# Patient Record
Sex: Male | Born: 1990 | Race: White | Hispanic: No | Marital: Single | Smoking: Never smoker
Health system: Southern US, Community
[De-identification: ages and names within clinical notes are randomized; demographics above are authoritative.]

---

## 2009-07-25 ENCOUNTER — Ambulatory Visit: Payer: Self-pay | Admitting: General Practice

## 2011-07-20 IMAGING — CT CT STONE STUDY
1 of 2 series · 15 of 32 positions shown, 19 images · non-contrast
Comparison: none

stone
COMMENTS:

PROCEDURE:     CT  - CT ABDOMEN /PELVIS WO (STONE)  - July 25, 2009  [DATE]
RESULT:     CT abdomen and pelvis dated 07/25/2009
TECHNIQUE: Helical noncontrasted 3 mm sections were obtained from the lung
bases through the pubic symphysis.

[Series 2: soft tissue · axial · 0.74mm/px · z∈[-855,-432]mm · 15 of 155 slices shown, 19 images]
[im 7/155  soft-tissue]
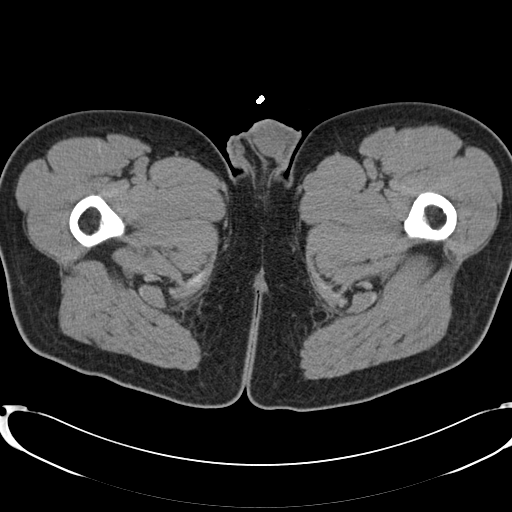
[im 7/155  bone]
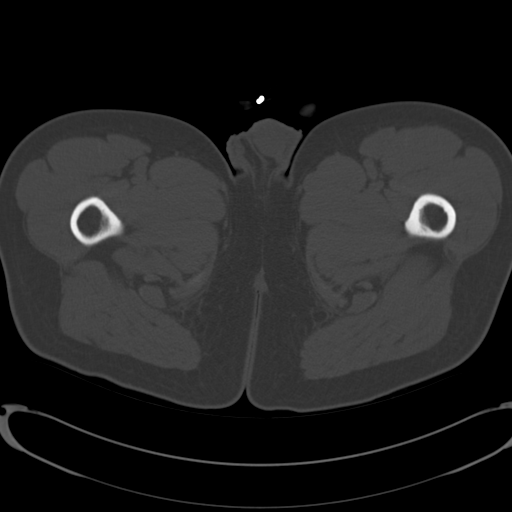
[im 20/155  soft-tissue]
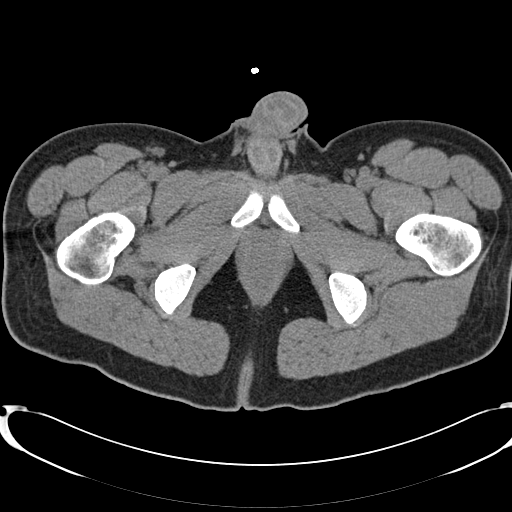
[im 33/155  soft-tissue]
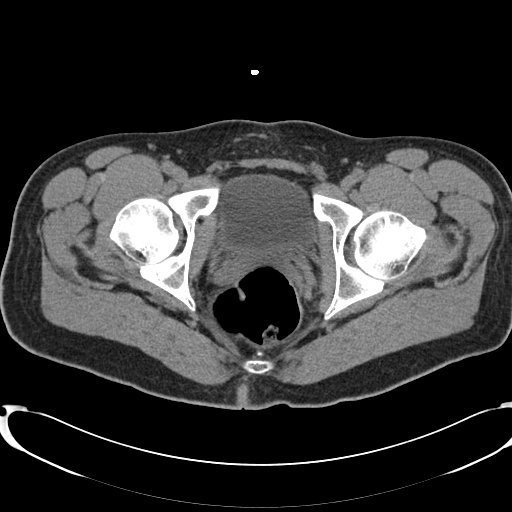
[im 45/155  soft-tissue]
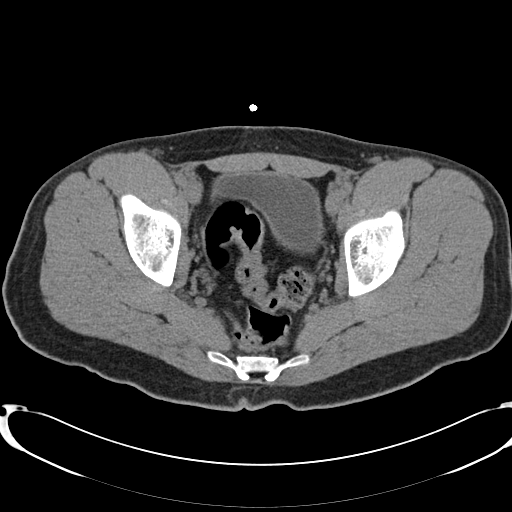
[im 52/155  soft-tissue]
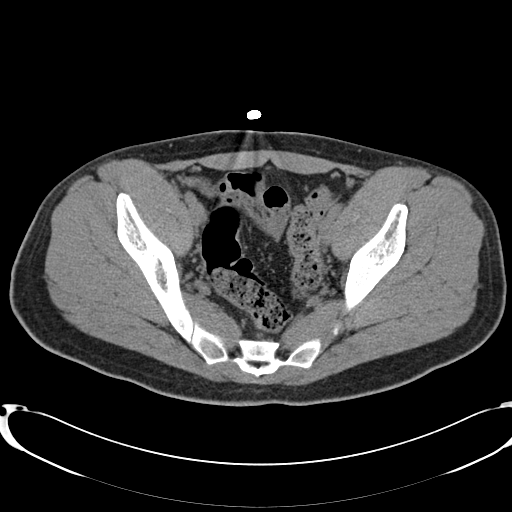
[im 65/155  soft-tissue]
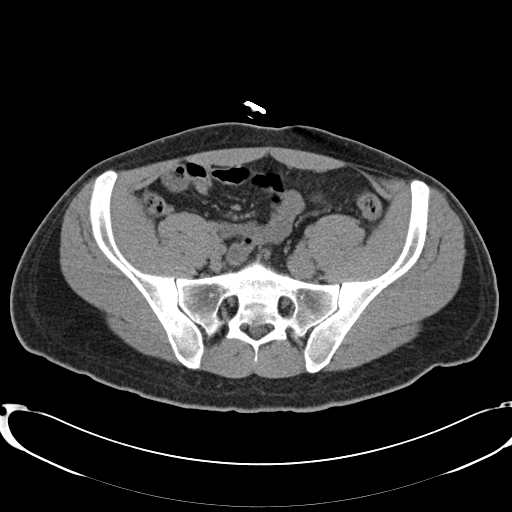
[im 78/155  soft-tissue]
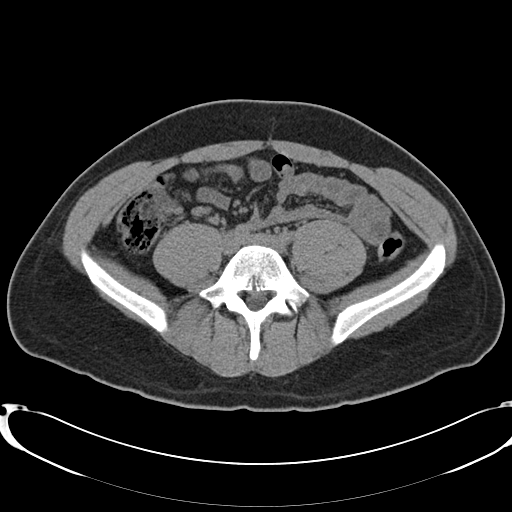
[im 90/155  soft-tissue]
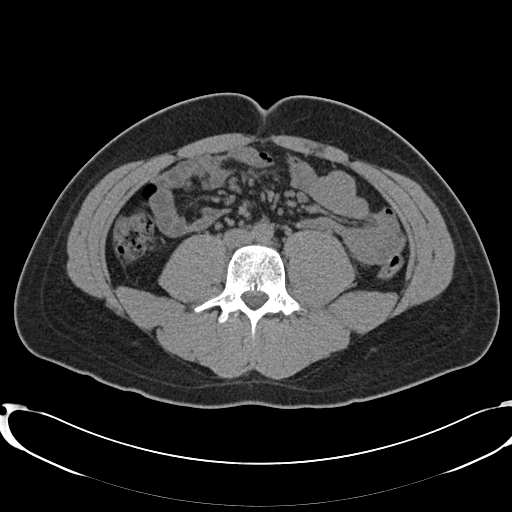
[im 103/155  soft-tissue]
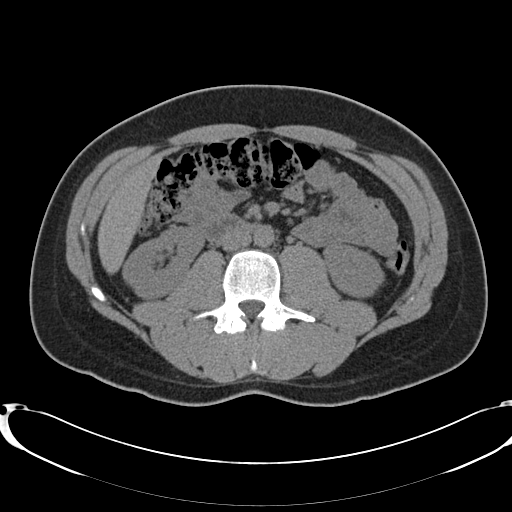
[im 103/155  bone]
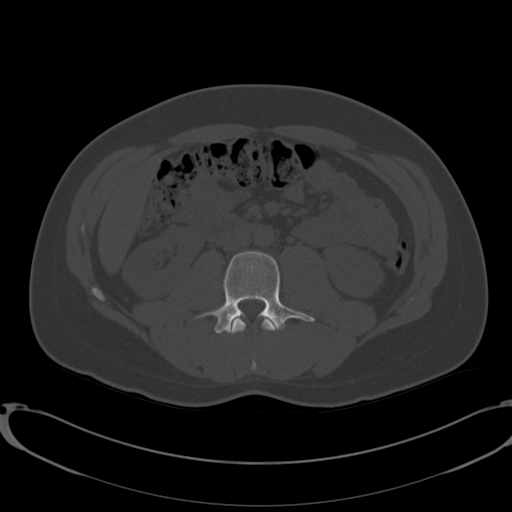
[im 110/155  soft-tissue]
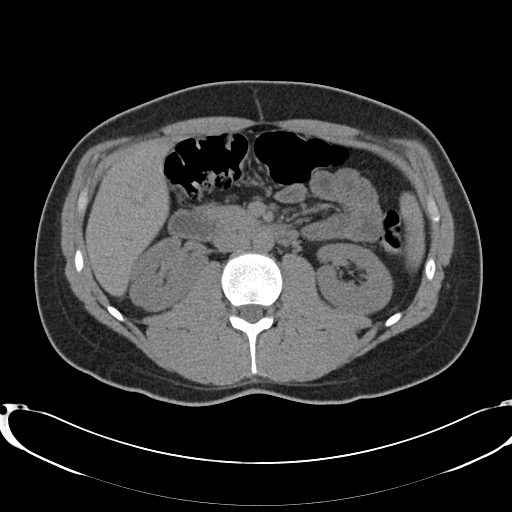
[im 122/155  soft-tissue]
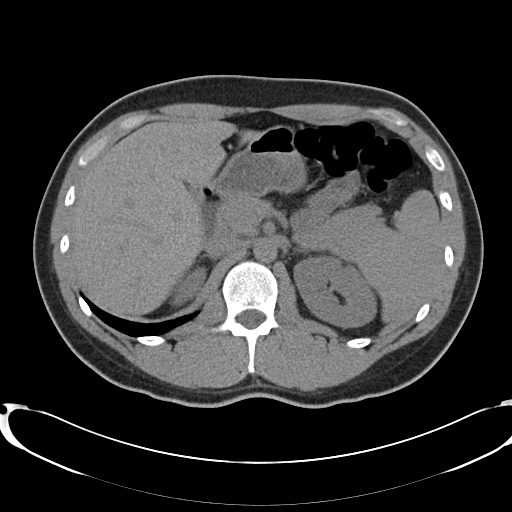
[im 129/155  lung]
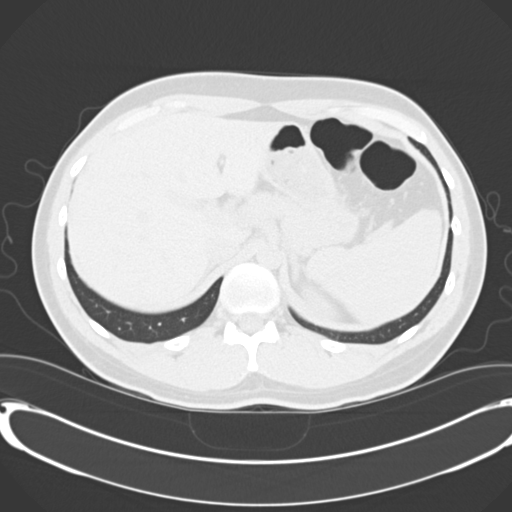
[im 135/155  soft-tissue]
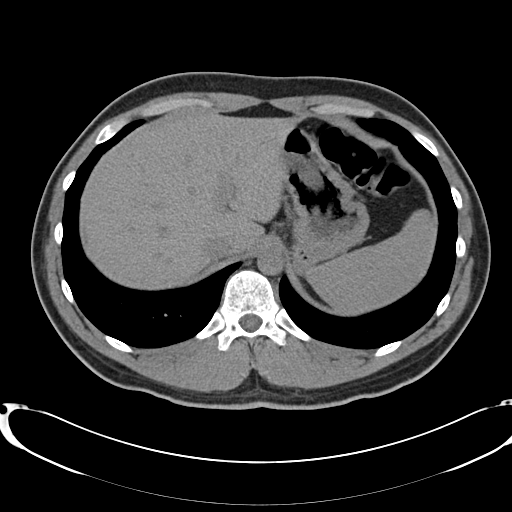
[im 135/155  lung]
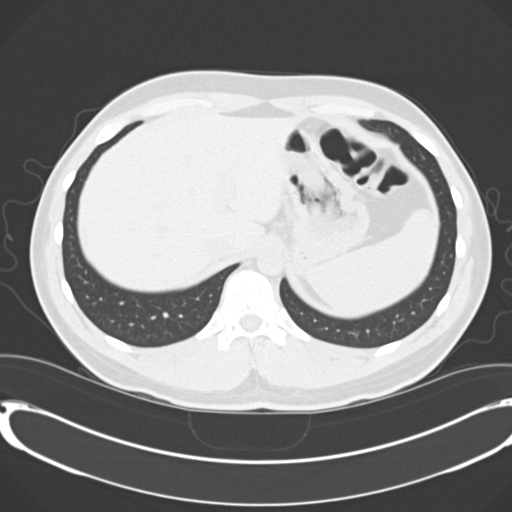
[im 142/155  lung]
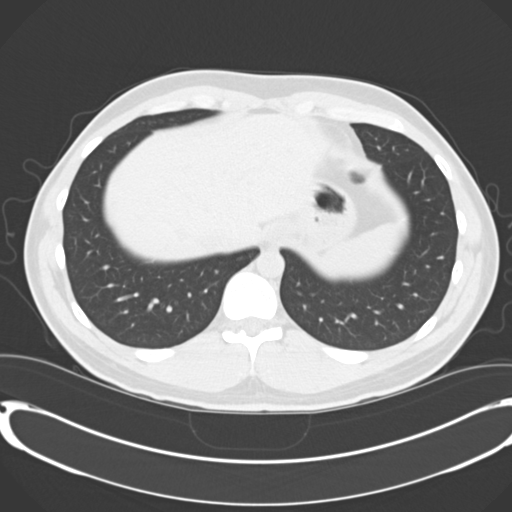
[im 148/155  soft-tissue]
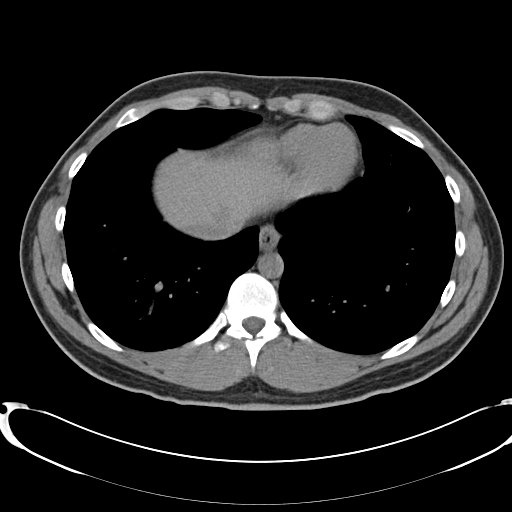
[im 148/155  lung]
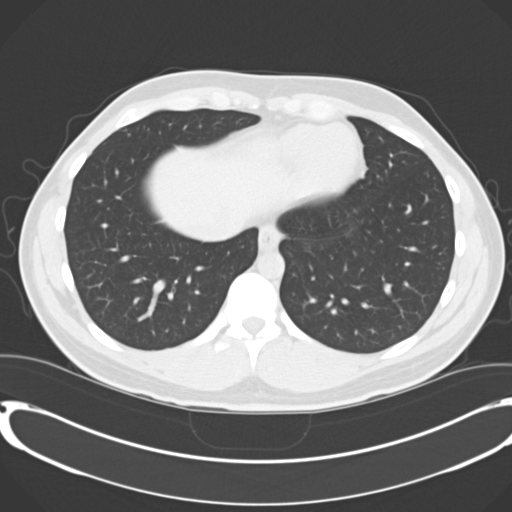

[15 of 32 positions shown; findings below may reference images not displayed]

FINDINGS: Evaluation of the lung bases demonstrates no gross abnormalities.

Noncontrast evaluation of the liver, spleen, adrenals, pancreas is
unremarkable. Evaluation of the right and left kidneys demonstrates no
evidence of hydronephrosis, hydroureter, nephrolithiasis, nor
ureterolithiasis. There is no gross evidence of renal masses. There is no
evidence of abdominal or pelvic free fluid, loculated fluid collections,
masses nor adenopathy. There is no CT evidence within the limitations of a
noncontrasted CT reflecting the sequela enteritis, colitis, nor
diverticulitis. There is no CT evidence of appendicitis. There is no
evidence of an abdominal aortic aneurysm.
IMPRESSION: No CT evidence of obstructive or inflammatory abnormalities
within the abdomen or pelvis.
2. Specifically there is no evidence of renal calculus disease nor
obstructive uropathy.

## 2015-12-13 ENCOUNTER — Encounter: Payer: Self-pay | Admitting: Emergency Medicine

## 2015-12-13 ENCOUNTER — Ambulatory Visit
Admission: EM | Admit: 2015-12-13 | Discharge: 2015-12-13 | Disposition: A | Discharge status: Home / Self Care | Payer: BLUE CROSS/BLUE SHIELD | Attending: Family Medicine | Admitting: Family Medicine

## 2015-12-13 ENCOUNTER — Ambulatory Visit (INDEPENDENT_AMBULATORY_CARE_PROVIDER_SITE_OTHER)
Admit: 2015-12-13 | Discharge: 2015-12-13 | Disposition: A | Discharge status: Home / Self Care | Payer: BLUE CROSS/BLUE SHIELD | Attending: Family Medicine | Admitting: Family Medicine

## 2015-12-13 DIAGNOSIS — R51 Headache: Secondary | ICD-10-CM

## 2015-12-13 DIAGNOSIS — R519 Headache, unspecified: Secondary | ICD-10-CM

## 2015-12-13 MED ORDER — PROMETHAZINE HCL 25 MG/ML IJ SOLN: 12.5000 mg | Freq: Once | INTRAMUSCULAR | Status: DC

## 2015-12-13 MED ORDER — KETOROLAC TROMETHAMINE 60 MG/2ML IM SOLN: 60.0000 mg | Freq: Once | INTRAMUSCULAR | Status: DC

## 2015-12-13 MED ORDER — KETOROLAC TROMETHAMINE 60 MG/2ML IM SOLN
60.0000 mg | Freq: Once | INTRAMUSCULAR | Status: AC
  Administered 2015-12-13: 60 mg via INTRAMUSCULAR

## 2015-12-13 MED ORDER — ONDANSETRON 4 MG PO TBDP
4.0000 mg | ORAL_TABLET | Freq: Once | ORAL | Status: AC
  Administered 2015-12-13: 4 mg via ORAL

## 2015-12-13 NOTE — Discharge Instructions (Signed)
Rest. Drink plenty of fluids. Avoid stressors and triggers.   Follow up with your primary care physician this week. Return to Urgent care or ER for continued headache, dizziness, weakness, vision changes, new or worsening concerns.    General Headache Without Cause A headache is pain or discomfort felt around the head or neck area. The specific cause of a headache may not be found. There are many causes and types of headaches. A few common ones are:  Tension headaches.  Migraine headaches.  Cluster headaches.  Chronic daily headaches. HOME CARE INSTRUCTIONS  Watch your condition for any changes. Take these steps to help with your condition: Managing Pain  Take over-the-counter and prescription medicines only as told by your health care provider.  Lie down in a dark, quiet room when you have a headache.  If directed, apply ice to the head and neck area:  Put ice in a plastic bag.  Place a towel between your skin and the bag.  Leave the ice on for 20 minutes, 2-3 times per day.  Use a heating pad or hot shower to apply heat to the head and neck area as told by your health care provider.  Keep lights dim if bright lights bother you or make your headaches worse. Eating and Drinking  Eat meals on a regular schedule.  Limit alcohol use.  Decrease the amount of caffeine you drink, or stop drinking caffeine. General Instructions  Keep all follow-up visits as told by your health care provider. This is important.  Keep a headache journal to help find out what may trigger your headaches. For example, write down:  What you eat and drink.  How much sleep you get.  Any change to your diet or medicines.  Try massage or other relaxation techniques.  Limit stress.  Sit up straight, and do not tense your muscles.  Do not use tobacco products, including cigarettes, chewing tobacco, or e-cigarettes. If you need help quitting, ask your health care provider.  Exercise  regularly as told by your health care provider.  Sleep on a regular schedule. Get 7-9 hours of sleep, or the amount recommended by your health care provider. SEEK MEDICAL CARE IF:   Your symptoms are not helped by medicine.  You have a headache that is different from the usual headache.  You have nausea or you vomit.  You have a fever. SEEK IMMEDIATE MEDICAL CARE IF:   Your headache becomes severe.  You have repeated vomiting.  You have a stiff neck.  You have a loss of vision.  You have problems with speech.  You have pain in the eye or ear.  You have muscular weakness or loss of muscle control.  You lose your balance or have trouble walking.  You feel faint or pass out.  You have confusion.   This information is not intended to replace advice given to you by your health care provider. Make sure you discuss any questions you have with your health care provider.   Document Released: 07/09/2005 Document Revised: 03/30/2015 Document Reviewed: 11/01/2014 Elsevier Interactive Patient Education 2016 [employer]  Migraine Headache A migraine headache is an intense, throbbing pain on one or both sides of your head. A migraine can last for 30 minutes to several hours. CAUSES  The exact cause of a migraine headache is not always known. However, a migraine may be caused when nerves in the brain become irritated and release chemicals that cause inflammation. This causes pain. Certain things may also  trigger migraines, such as:  Alcohol.  Smoking.  Stress.  Menstruation.  Aged cheeses.  Foods or drinks that contain nitrates, glutamate, aspartame, or tyramine.  Lack of sleep.  Chocolate.  Caffeine.  Hunger.  Physical exertion.  Fatigue.  Medicines used to treat chest pain (nitroglycerine), birth control pills, estrogen, and some blood pressure medicines. SIGNS AND SYMPTOMS  Pain on one or both sides of your head.  Pulsating or throbbing pain.  Severe  pain that prevents daily activities.  Pain that is aggravated by any physical activity.  Nausea, vomiting, or both.  Dizziness.  Pain with exposure to bright lights, loud noises, or activity.  General sensitivity to bright lights, loud noises, or smells. Before you get a migraine, you may get warning signs that a migraine is coming (aura). An aura may include:  Seeing flashing lights.  Seeing bright spots, halos, or zigzag lines.  Having tunnel vision or blurred vision.  Having feelings of numbness or tingling.  Having trouble talking.  Having muscle weakness. DIAGNOSIS  A migraine headache is often diagnosed based on:  Symptoms.  Physical exam.  A CT scan or MRI of your head. These imaging tests cannot diagnose migraines, but they can help rule out other causes of headaches. TREATMENT Medicines may be given for pain and nausea. Medicines can also be given to help prevent recurrent migraines.  HOME CARE INSTRUCTIONS  Only take over-the-counter or prescription medicines for pain or discomfort as directed by your health care provider. The use of long-term narcotics is not recommended.  Lie down in a dark, quiet room when you have a migraine.  Keep a journal to find out what may trigger your migraine headaches. For example, write down:  What you eat and drink.  How much sleep you get.  Any change to your diet or medicines.  Limit alcohol consumption.  Quit smoking if you smoke.  Get 7-9 hours of sleep, or as recommended by your health care provider.  Limit stress.  Keep lights dim if bright lights bother you and make your migraines worse. SEEK IMMEDIATE MEDICAL CARE IF:   Your migraine becomes severe.  You have a fever.  You have a stiff neck.  You have vision loss.  You have muscular weakness or loss of muscle control.  You start losing your balance or have trouble walking.  You feel faint or pass out.  You have severe symptoms that are different  from your first symptoms. MAKE SURE YOU:   Understand these instructions.  Will watch your condition.  Will get help right away if you are not doing well or get worse.   This information is not intended to replace advice given to you by your health care provider. Make sure you discuss any questions you have with your health care provider.   Document Released: 07/09/2005 Document Revised: 07/30/2014 Document Reviewed: 03/16/2013 Elsevier Interactive Patient Education [employer]

## 2015-12-13 NOTE — ED Notes (Signed)
Pt presents with headache that started yesterday, describes as starting at the base of his skull and radiates with pressure to behind left eye. Yesterday had some blurred vision in his left eye, but none today. No hx of headaches.

## 2015-12-13 NOTE — ED Notes (Signed)
I spoke to Jasmine DecemberSharon at [facility] to get prior authorization for CT head w/o contrast. Approval # [id number].

## 2015-12-13 NOTE — ED Provider Notes (Signed)
[facility]  ____________________________________________  Time seen: Approximately 1300 PM  I have reviewed the triage vital signs and the nursing notes.   HISTORY  Chief Complaint Headache   HPI Richard Levy is a 25 y.o. malepresents with a complaint of headache since yesterday evening. Patient reports that yesterday evening he [place] greenhouse and states that he began to have a left-sided headache. Patient reports that just prior to his headache onset he had a very brief episode of left eye blurriness that he describes that lasted for a few minutes. Patient reports that he then had a gradual onset of a left sided headache that felt like a throbbing sensation only to the left side of his head. Patient reports that the headache onset was over approximately 1 hour. Patient reports that he is here today as he has had a headache since. Patient reports that he did take over-the-counter ibuprofen last night as well as this morning but did not help his headache. Patient denies any known triggers for this headache. Patient reports that he has very rarely had any headaches in the past. Patient reports that the vision complaint was only present for a few minutes and has since resolved without any other vision changes.  Denies any fall, head injury or trauma. Denies any recent sickness. Denies vision changes at this time. Denies neck pain, back pain, weakness, dizziness, recent sickness, numbness or tingling sensation. Patient denies known tick bites but reports frequently outside and has ticks around him and expresses concern about any tick-related diseases.   History reviewed. No pertinent past medical history. Denies There are no active problems to display for this patient. Denies  History reviewed. No pertinent past surgical history. Denies No current outpatient prescriptions on file. Denies Allergies Review of patient's allergies indicates no known allergies.  No  family history on file.  Patient states that mom dad and siblings are alive and healthy without medical problems. Social History Social History  Substance Use Topics  . Smoking status: Never Smoker   . Smokeless tobacco: None  . Alcohol Use: Yes    Review of Systems Constitutional: No fever/chills Eyes: No visual changes. ENT: No sore throat. Cardiovascular: Denies chest pain. Respiratory: Denies shortness of breath. Gastrointestinal: No abdominal pain.  No nausea, no vomiting.  No diarrhea.  No constipation. Genitourinary: Negative for dysuria. Musculoskeletal: Negative for back pain. Skin: Negative for rash. Neurological: Negative for headaches, focal weakness or numbness.  10-point ROS otherwise negative.  ____________________________________________   PHYSICAL EXAM:  VITAL SIGNS: ED Triage Vitals  Enc Vitals Group     BP 12/13/15 1149 170/86 mmHg     Pulse Rate 12/13/15 1149 79     Resp 12/13/15 1149 18     Temp 12/13/15 1149 98.3 F (36.8 C)     Temp Source 12/13/15 1149 Oral     SpO2 12/13/15 1149 100 %     Weight 12/13/15 1149 195 lb (88.451 kg)     Height 12/13/15 1149 6' (1.829 m)     Head Cir --      Peak Flow --      Pain Score 12/13/15 1151 8     Pain Loc --      Pain Edu? --      Excl. in GC? --    Today's Vitals   12/13/15 1151 12/13/15 1312 12/13/15 1441 12/13/15 1515  BP:  147/102 141/84   Pulse:  67 65   Temp:  TempSrc:      Resp:   18   Height:      Weight:      SpO2:   100%   PainSc: 8    5   States he feels like his blood pressure was elevated as his girlfriend was causing him to be stressed.   Constitutional: Alert and oriented. Well appearing and in no acute distress. Eyes: Conjunctivae are normal. PERRL. EOMI. No pain with EOMs. Anterior chambers with limited bedside exam normal, no papilledema.  Head: Atraumatic. No tenderness over temporal arteries. No sinus TTP. No tenderness to palpation.   Ears: no erythema, normal TMs  bilaterally.   Nose: No congestion/rhinnorhea.  Mouth/Throat: Mucous membranes are moist.  Oropharynx non-erythematous.No tonsillar swelling or exudate.  Neck: No stridor.  No cervical spine tenderness to palpation. No carotid bruits.  Hematological/Lymphatic/Immunilogical: No cervical lymphadenopathy. Cardiovascular: Normal rate, regular rhythm. Grossly normal heart sounds.  Good peripheral circulation. Respiratory: Normal respiratory effort.  No retractions. Lungs CTAB.No wheezes, rales or rhonchi. Gastrointestinal: Soft and nontender. No distention. Normal Bowel sounds.  No abdominal bruits. No CVA tenderness. Musculoskeletal: No lower or upper extremity tenderness nor edema.  No joint effusions. Bilateral pedal pulses equal and easily palpated. No cervical, thoracic or lumbar TTP.  Neurologic:  Normal speech and language. No gross focal neurologic deficits are appreciated. No gait instability. CN II-XII. No ataxia, normal finger to nose. Negative Romberg. Negative brudzkinski's and negative kernig's. No meningismus.   Skin:  Skin is warm, dry and intact. No rash noted. Psychiatric: Mood and affect are normal. Speech and behavior are normal.  ____________________________________________   LABS (all labs ordered are listed, but only abnormal results are displayed)                        LYME DISEASE DNA BY PCR([facility])  [facility]    RADIOLOGY  No results found.   INITIAL IMPRESSION / ASSESSMENT AND PLAN / ED COURSE  Pertinent labs & imaging results that were available during my care of the patient were reviewed by me and considered in my medical decision making (see chart for details).  Overall well-appearing patient. Presents with complaints of left-sided unilateral headache since yesterday evening. Patient reports headache has not been resolved with over-the-counter ibuprofen. No focal neurological deficits. Denies vision complaints at this time. Patient  states left-sided headache that encompasses all of left side of head. Denies trauma. Discussed with patient evaluation by CT head as per patient this is atypical. Will evaluate CT head. Initially was to have to order CT head outpatient however schedule change [facility]. Toradol 60 mg IM and Zofran 4 mg ODT.  After Toradol and Zofran patient reports headache has improved and states that he is feeling better. States headache is now 5-6 out of 10. Denies any other pain or complaints. Patient states that he is feeling better and is ready to go home. Patient girlfriend now at bedside. Discussed patient and plan of care with Dr. Judd Gaudieronty who agrees with treatment and plan. Discussed in detail with patient to have rest and close follow-up with primary care physician. Encouraged patient that if pain does not improve or worsens or new or worsening concerns go directly to emergency room for further evaluation.  Discussed follow up with Primary care physician this week. Discussed follow up and return parameters including no resolution or any worsening concerns. Patient verbalized understanding and agreed  to plan.   ____________________________________________   FINAL CLINICAL IMPRESSION(S) / ED DIAGNOSES  Final diagnoses:  Acute nonintractable headache, unspecified headache type     There are no discharge medications for this patient.   Note: This dictation was prepared with Dragon dictation along with smaller phrase technology. Any transcriptional errors that result from this process are unintentional.       Renford Dills, NP 12/21/15 [phone]

## 2015-12-15 LAB — ROCKY MTN SPOTTED FVR ABS PNL(IGG+IGM)
RMSF IGG: POSITIVE — AB
RMSF IGM: 0.61 {index} (ref 0.00–0.89)

## 2015-12-15 LAB — RMSF, IGG, IFA

## 2015-12-16 ENCOUNTER — Telehealth: Payer: Self-pay | Admitting: Internal Medicine

## 2015-12-16 NOTE — Telephone Encounter (Signed)
-----   Message from Eustace MooreLaura W Alannie Amodio, MD sent at 12/16/2015  1:56 PM EDT ----- Lyme titers are pending. 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  No fever, no rash documented in UC visit note 12/13/15.  Recheck for further evaluation if headache persists/worsens.  LM

## 2015-12-16 NOTE — ED Notes (Signed)
Inadvertent order for CSF Lyme test, order resubmitted as blood test.  LM  Eustace MooreLaura W Tianne Plott, MD 12/16/15 [phone]

## 2015-12-18 LAB — LYME DISEASE DNA BY PCR(BORRELIA BURG): LYME DISEASE(B. BURGDORFERI) PCR: NEGATIVE

## 2015-12-20 LAB — B. BURGDORFI ANTIBODIES

## 2015-12-27 ENCOUNTER — Telehealth: Payer: Self-pay | Admitting: Emergency Medicine

## 2016-01-12 NOTE — ED Notes (Signed)
No note

## 2017-12-07 IMAGING — CT CT HEAD W/O CM
1 series · 16 of 30 positions shown, 20 images · non-contrast
Comparison: None

CLINICAL DATA: LEFT side headache began yesterday, began at base of
skull radiates with pressure to behind LEFT eye, some blurred vision
LEFT eye yesterday that is improved today

EXAM:
CT HEAD WITHOUT CONTRAST
TECHNIQUE: Contiguous axial images were obtained from the base of the skull
through the vertex without intravenous contrast.

[Series 2: head wo · axial · 0.39mm/px · z∈[-28,+107]mm · 16 of 31 slices shown, 20 images]
[im 2/31  brain]
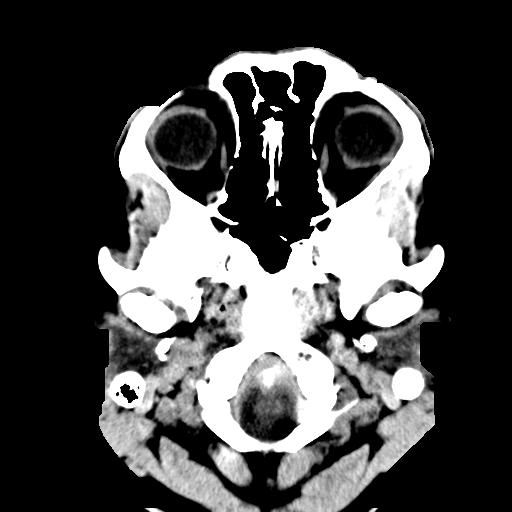
[im 2/31  bone]
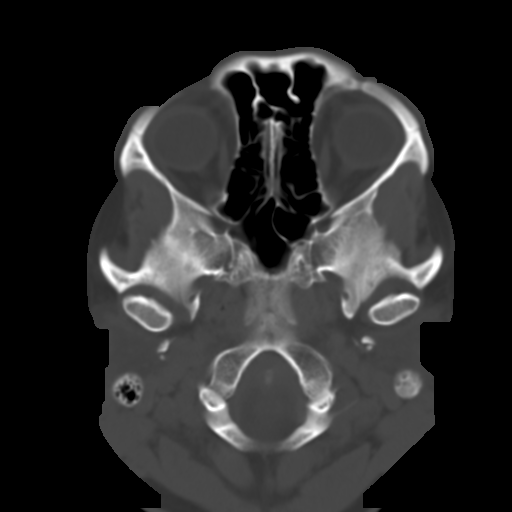
[im 4/31  brain]
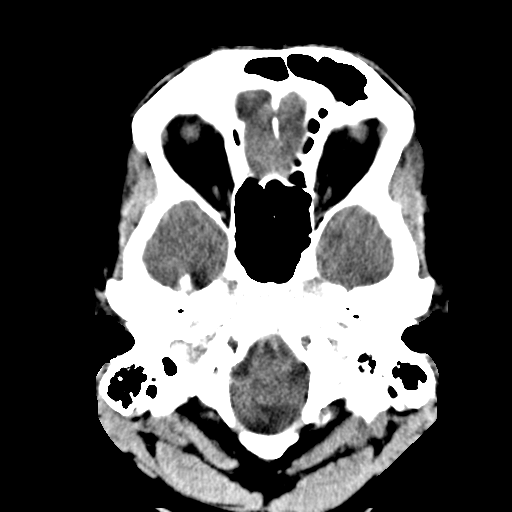
[im 6/31  brain]
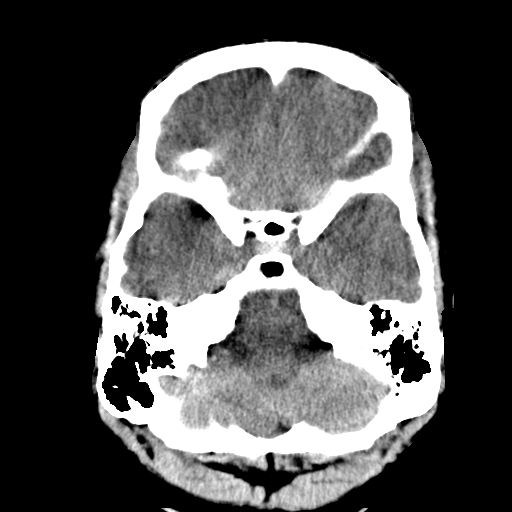
[im 8/31  brain]
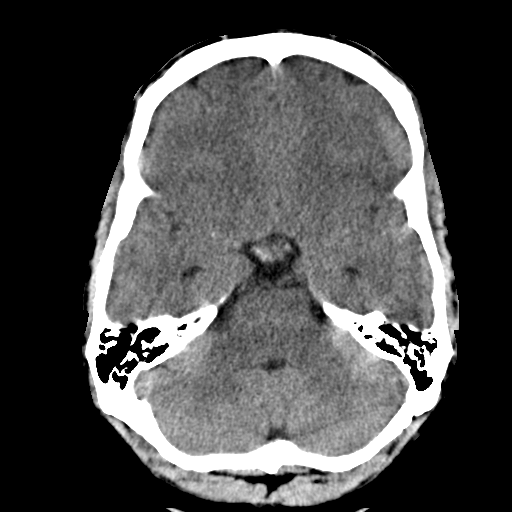
[im 9/31  brain]
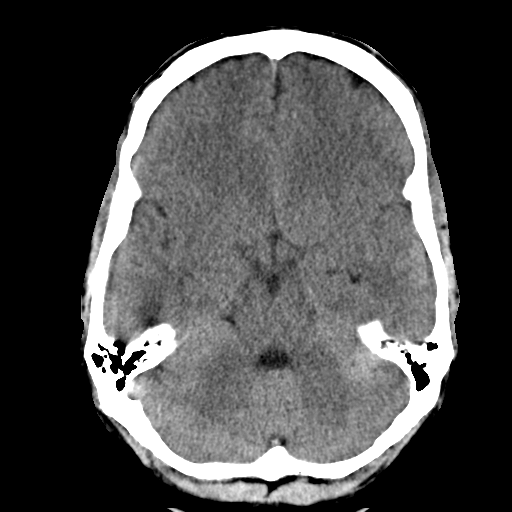
[im 9/31  bone]
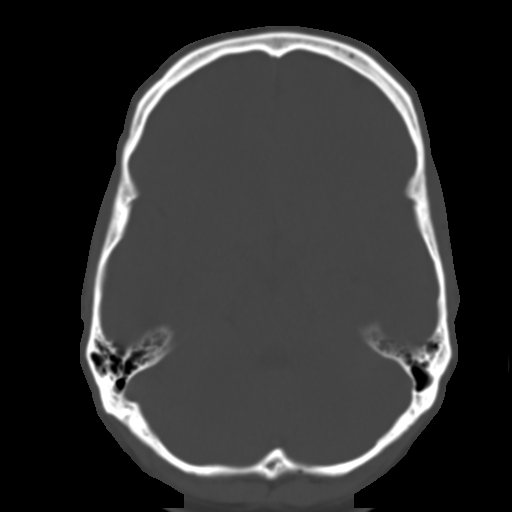
[im 11/31  brain]
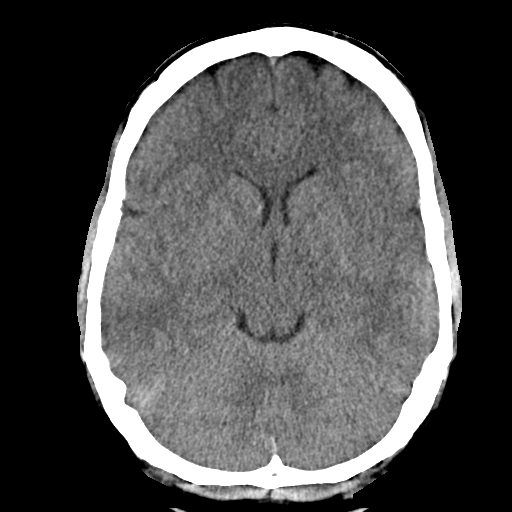
[im 13/31  brain]
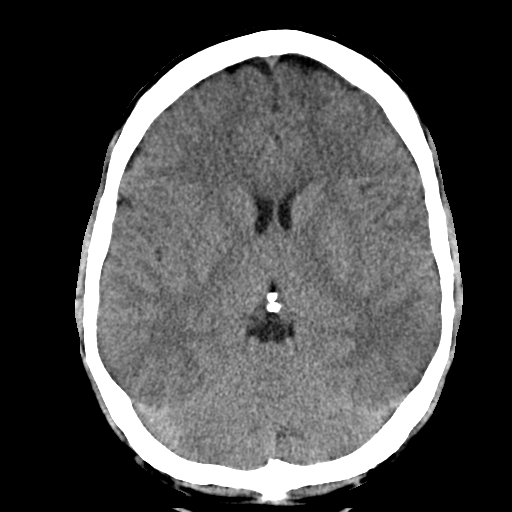
[im 15/31  brain]
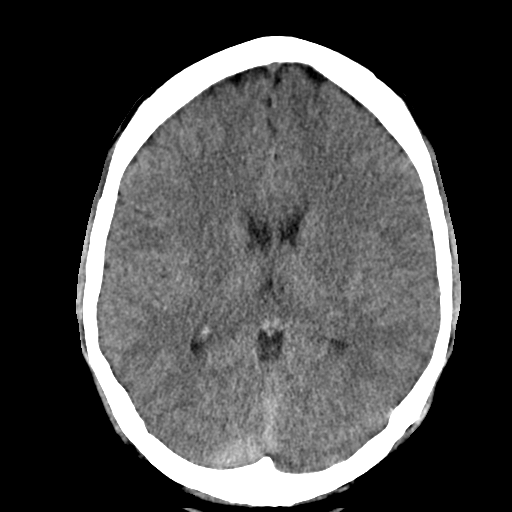
[im 16/31  brain]
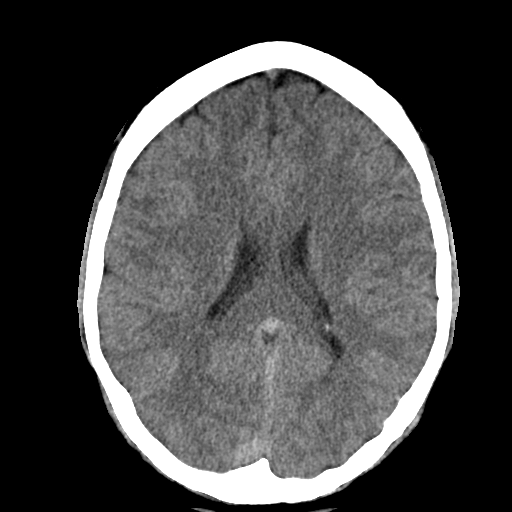
[im 16/31  bone]
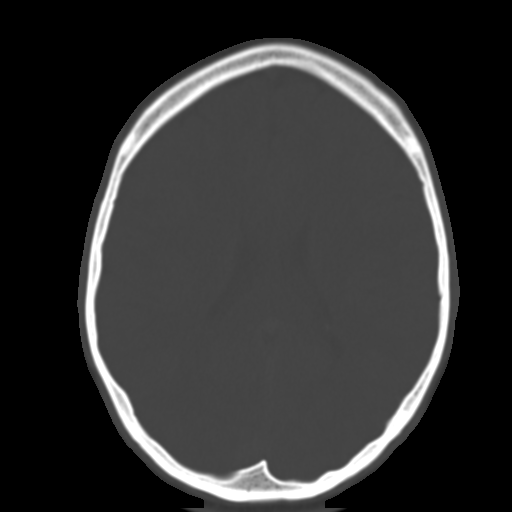
[im 18/31  brain]
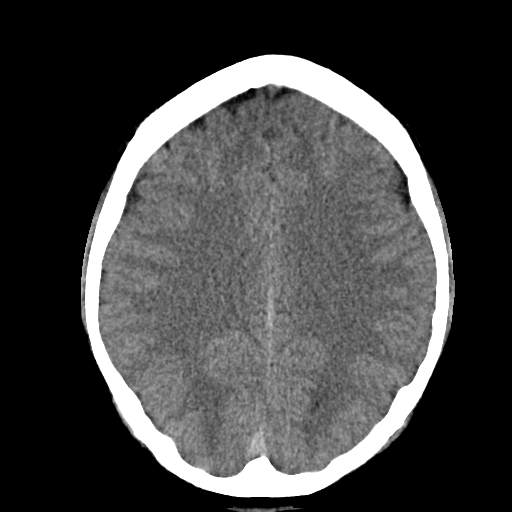
[im 20/31  brain]
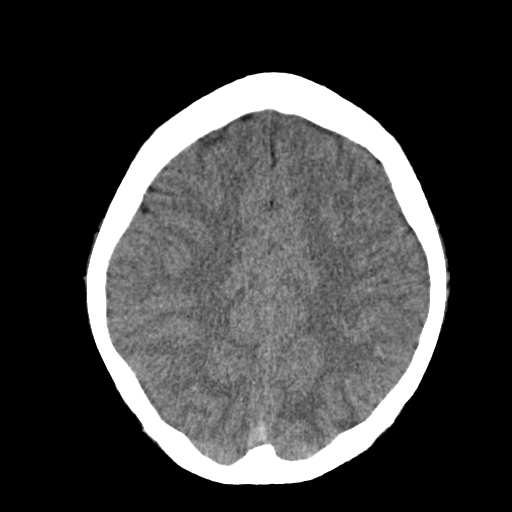
[im 22/31  brain]
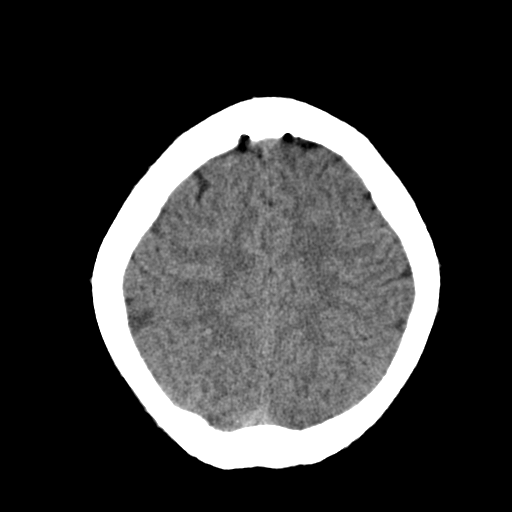
[im 23/31  brain]
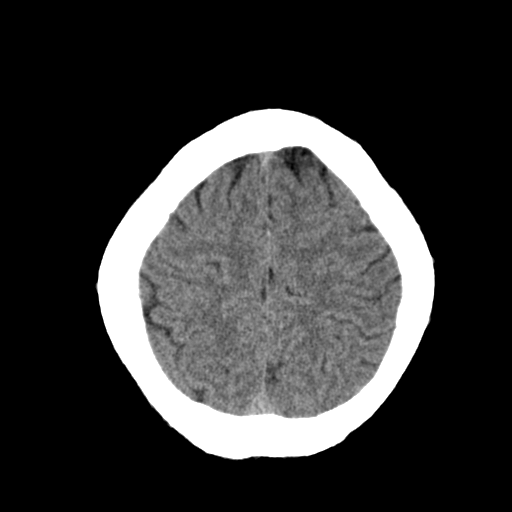
[im 23/31  bone]
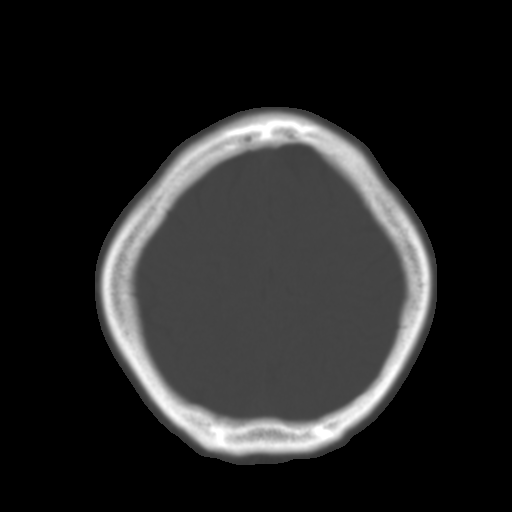
[im 25/31  brain]
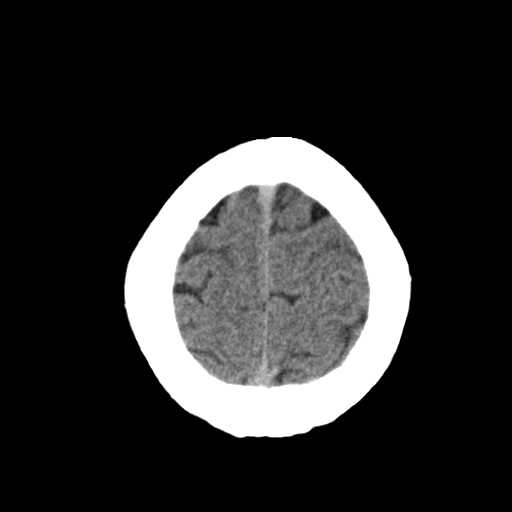
[im 27/31  brain]
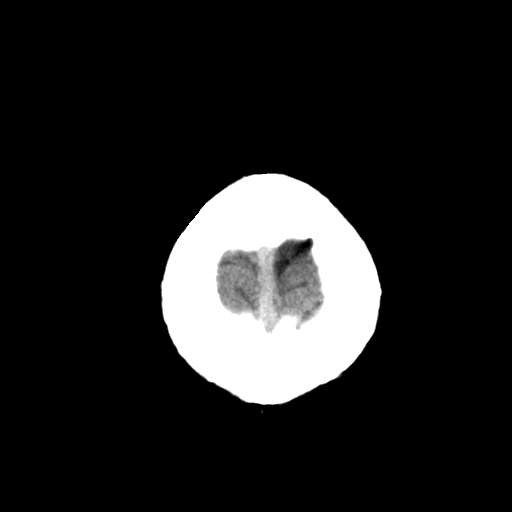
[im 29/31  brain]
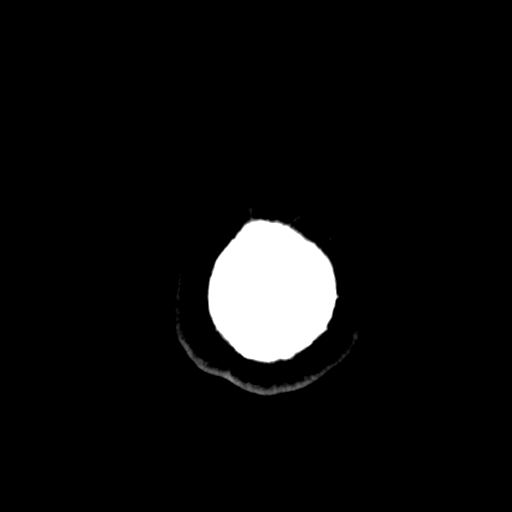

[16 of 30 positions shown; findings below may reference images not displayed]

FINDINGS: Normal ventricular morphology.

No midline shift or mass effect.

Normal appearance of brain parenchyma.

No intracranial hemorrhage, mass lesion, or acute infarction.

Visualized paranasal sinuses and mastoid air cells clear.

Bones unremarkable.
IMPRESSION: Normal exam.
